# Patient Record
Sex: Male | Born: 1970 | Race: White | Hispanic: No | Marital: Married | Smoking: Never smoker
Health system: Southern US, Community
[De-identification: ages and names within clinical notes are randomized; demographics above are authoritative.]

## PROBLEM LIST (undated history)

## (undated) DIAGNOSIS — I10 Essential (primary) hypertension: Secondary | ICD-10-CM

## (undated) HISTORY — PX: NO PAST SURGERIES: SHX2092

---

## 2009-10-14 ENCOUNTER — Ambulatory Visit: Payer: Self-pay | Admitting: Urology

## 2009-11-05 ENCOUNTER — Ambulatory Visit: Payer: Self-pay | Admitting: Urology

## 2010-04-21 ENCOUNTER — Ambulatory Visit: Payer: Self-pay | Admitting: Internal Medicine

## 2018-01-08 HISTORY — PX: MOHS SURGERY: SUR867

## 2018-02-22 ENCOUNTER — Other Ambulatory Visit: Payer: Self-pay

## 2018-02-22 ENCOUNTER — Ambulatory Visit
Admission: EM | Admit: 2018-02-22 | Discharge: 2018-02-22 | Disposition: A | Discharge status: Home / Self Care | Payer: BLUE CROSS/BLUE SHIELD | Attending: Family Medicine | Admitting: Family Medicine

## 2018-02-22 ENCOUNTER — Encounter: Payer: Self-pay | Admitting: Emergency Medicine

## 2018-02-22 ENCOUNTER — Ambulatory Visit (INDEPENDENT_AMBULATORY_CARE_PROVIDER_SITE_OTHER)
Admit: 2018-02-22 | Discharge: 2018-02-22 | Disposition: A | Discharge status: Home / Self Care | Payer: BLUE CROSS/BLUE SHIELD

## 2018-02-22 DIAGNOSIS — J3489 Other specified disorders of nose and nasal sinuses: Secondary | ICD-10-CM

## 2018-02-22 DIAGNOSIS — J329 Chronic sinusitis, unspecified: Secondary | ICD-10-CM | POA: Diagnosis not present

## 2018-02-22 DIAGNOSIS — R51 Headache: Secondary | ICD-10-CM | POA: Diagnosis not present

## 2018-02-22 DIAGNOSIS — R519 Headache, unspecified: Secondary | ICD-10-CM

## 2018-02-22 HISTORY — DX: Essential (primary) hypertension: I10

## 2018-02-22 MED ORDER — FLUTICASONE PROPIONATE 50 MCG/ACT NA SUSP: 2.0000 | Freq: Every day | NASAL | 0 refills | Status: DC

## 2018-02-22 MED ORDER — PREDNISONE 50 MG PO TABS: ORAL_TABLET | ORAL | 0 refills | Status: DC

## 2018-02-22 MED ORDER — AMOXICILLIN-POT CLAVULANATE 875-125 MG PO TABS: 1.0000 | ORAL_TABLET | Freq: Two times a day (BID) | ORAL | 0 refills | Status: DC

## 2018-02-22 NOTE — ED Triage Notes (Signed)
Pt here today c/o of a headache. He reports that he has had it for about a week. Usually starts after he has been up for a while. He takes something for it (today Ibuprofen) the headache goes away but his head in that area is worse to the touch. Headache and tenderness is located above his left eye. Denies fevers, chills,  cold symptoms, n/v.No recent head trauma.

## 2018-02-22 NOTE — ED Provider Notes (Signed)
[facility]    CSN: [id number] Arrival date & time: 02/22/18  1430  History   Chief Complaint Chief Complaint  Patient presents with  . Headache   HPI  47 year old male presents with headache.  Headache  Patient reports a one-week history of daily headache.  Location: Left frontal region.  Patient reports that his headache has been severe.  Improves with ibuprofen.  No nausea/vomiting.  No photophobia.  No reports of eye watering.  No rhinorrhea or purulent nasal discharge.  He does describe the pain as a pressure sensation.  No known exacerbating factors.  No other associated symptoms.  No other complaints.  Past Medical History:  Diagnosis Date  . Hypertension    Past Surgical History:  Procedure Laterality Date  . MOHS SURGERY  01/2018  . NO PAST SURGERIES      Home Medications    Prior to Admission medications   Medication Sig Start Date End Date Taking? Authorizing Provider  valsartan-hydrochlorothiazide (DIOVAN-HCT) 320-25 MG tablet TAKE 1 TABLET BY MOUTH EVERY DAY 05/03/17  Yes [provider]  amoxicillin-clavulanate (AUGMENTIN) 875-125 MG tablet Take 1 tablet by mouth every 12 (twelve) hours. 02/22/18   Tommie Sams, DO  fluticasone (FLONASE) 50 MCG/ACT nasal spray Place 2 sprays into both nostrils daily. 02/22/18   Tommie Sams, DO  predniSONE (DELTASONE) 50 MG tablet 1 tablet daily x 5 days. 02/22/18   Tommie Sams, DO    Family History Family History  Problem Relation Age of Onset  . Healthy Mother   . Healthy Father     Social History Social History   Tobacco Use  . Smoking status: Never Smoker  . Smokeless tobacco: Never Used  Substance Use Topics  . Alcohol use: Never    Frequency: Never  . Drug use: Never     Allergies   Patient has no known allergies.   Review of Systems Review of Systems  Constitutional: Negative.   Eyes: Negative.   Gastrointestinal: Negative.   Neurological: Positive for  headaches.   Physical Exam Triage Vital Signs ED Triage Vitals  Enc Vitals Group     BP 02/22/18 1447 (!) 128/99     Pulse Rate 02/22/18 1447 80     Resp 02/22/18 1447 17     Temp 02/22/18 1447 98.4 F (36.9 C)     Temp Source 02/22/18 1447 Oral     SpO2 02/22/18 1447 98 %     Weight 02/22/18 1447 235 lb (106.6 kg)     Height 02/22/18 1447 5\' 9"  (1.753 m)     Head Circumference --      Peak Flow --      Pain Score 02/22/18 1446 1     Pain Loc --      Pain Edu? --      Excl. in GC? --    Updated Vital Signs BP (!) 128/99 (BP Location: Left Arm)   Pulse 80   Temp 98.4 F (36.9 C) (Oral)   Resp 17   Ht 5\' 9"  (1.753 m)   Wt 235 lb (106.6 kg)   SpO2 98%   BMI 34.70 kg/m   Visual Acuity Right Eye Distance:   Left Eye Distance:   Bilateral Distance:    Right Eye Near:   Left Eye Near:    Bilateral Near:     Physical Exam  Constitutional: He is oriented to person, place, and time. He appears well-developed. No distress.  HENT:  Head:  Normocephalic and atraumatic.  Patient with a discrete area of tenderness of the left frontal region.  Eyes: Conjunctivae are normal. Right eye exhibits no discharge. Left eye exhibits no discharge.  Cardiovascular: Normal rate and regular rhythm.  Pulmonary/Chest: Effort normal and breath sounds normal. He has no wheezes. He has no rales.  Neurological: He is alert and oriented to person, place, and time.  Psychiatric: He has a normal mood and affect. His behavior is normal.  Nursing note and vitals reviewed.  UC Treatments / Results  Labs (all labs ordered are listed, but only abnormal results are displayed) Labs Reviewed - No data to display  EKG None  Radiology Ct Head Wo Contrast  Result Date: 02/22/2018 CLINICAL DATA:  Headache with pressure over left eye region from 1 week EXAM: CT HEAD WITHOUT CONTRAST TECHNIQUE: Contiguous axial images were obtained from the base of the skull through the vertex without intravenous  contrast. COMPARISON:  None. FINDINGS: Brain: The ventricles are normal in size and configuration. There is an asymmetrically prominent sulcus in the left frontal region, a likely anatomic variant. There is no intracranial mass, hemorrhage, extra-axial fluid collection, or midline shift. Gray-white compartments appear within normal limits. No evident acute infarct. Vascular: No hyperdense vessel.  No evident vascular calcification. Skull: Bony calvarium appears intact. Sinuses/Orbits: There is opacification of multiple anterior ethmoid air cells on the left as well as essentially the entire left frontal sinus. There is opacification in the superior most aspect of the left maxillary antrum. There is evidence of nares obstruction on the left, likely due to polypoid change. Visualized orbits appear symmetric bilaterally. Other: Visualized mastoid air cells are clear. IMPRESSION: Multifocal paranasal sinus disease on the left. Obstruction of the visualized left naris, probably due to polypoid change. No intracranial mass or hemorrhage. Gray-white compartments appear normal. Electronically Signed   By: Bretta BangWilliam  Woodruff III M.D.   On: 02/22/2018 16:52    Procedures Procedures (including critical care time)  Medications Ordered in UC Medications - No data to display  Initial Impression / Assessment and Plan / UC Course  I have reviewed the triage vital signs and the nursing notes.  Pertinent labs & imaging results that were available during my care of the patient were reviewed by me and considered in my medical decision making (see chart for details).    47 year old male presents with new onset headache.  Etiology was unclear so CT was obtained.  CT revealed extensive sinus disease, particularly in the frontal region.  Placed on Augmentin, prednisone, Flonase.  Advised to see ENT if he fails to improve or worsens.  Final Clinical Impressions(s) / UC Diagnoses   Final diagnoses:  Sinus headache      Discharge Instructions     Meds as prescribed.  Take care  Dr. Adriana Simasook    ED Prescriptions    Medication Sig Dispense Auth. Provider   fluticasone (FLONASE) 50 MCG/ACT nasal spray Place 2 sprays into both nostrils daily. 16 g Jorrell Kuster G, DO   predniSONE (DELTASONE) 50 MG tablet 1 tablet daily x 5 days. 5 tablet Clarene Curran G, DO   amoxicillin-clavulanate (AUGMENTIN) 875-125 MG tablet Take 1 tablet by mouth every 12 (twelve) hours. 14 tablet Tommie Samsook, Rickiya Picariello G, DO     Controlled Substance Prescriptions [place] Controlled Substance Registry consulted? Not Applicable   Tommie SamsCook, Majd Tissue G, DO 02/22/18 1744

## 2018-02-22 NOTE — Discharge Instructions (Signed)
Meds as prescribed. ° °Take care ° °Dr. Chevon Fomby  °

## 2018-02-22 NOTE — ED Triage Notes (Signed)
Called for for pre cert for ct scan of head CT. auth # [id number]

## 2018-02-23 ENCOUNTER — Ambulatory Visit: Admit: 2018-02-23 | Payer: BLUE CROSS/BLUE SHIELD

## 2018-03-02 ENCOUNTER — Encounter: Payer: Self-pay | Admitting: Emergency Medicine

## 2018-03-02 ENCOUNTER — Ambulatory Visit (INDEPENDENT_AMBULATORY_CARE_PROVIDER_SITE_OTHER): Payer: BLUE CROSS/BLUE SHIELD

## 2018-03-02 ENCOUNTER — Other Ambulatory Visit: Payer: Self-pay

## 2018-03-02 ENCOUNTER — Ambulatory Visit
Admission: EM | Admit: 2018-03-02 | Discharge: 2018-03-02 | Disposition: A | Discharge status: Home / Self Care | Payer: BLUE CROSS/BLUE SHIELD | Attending: Family Medicine | Admitting: Family Medicine

## 2018-03-02 DIAGNOSIS — S56912A Strain of unspecified muscles, fascia and tendons at forearm level, left arm, initial encounter: Secondary | ICD-10-CM

## 2018-03-02 DIAGNOSIS — X500XXA Overexertion from strenuous movement or load, initial encounter: Secondary | ICD-10-CM

## 2018-03-02 DIAGNOSIS — M79602 Pain in left arm: Secondary | ICD-10-CM

## 2018-03-02 MED ORDER — HYDROCODONE-ACETAMINOPHEN 5-325 MG PO TABS: ORAL_TABLET | ORAL | 0 refills | Status: DC

## 2018-03-02 NOTE — ED Provider Notes (Signed)
[facility]    CSN: [id number] Arrival date & time: 03/02/18  1540     History   Chief Complaint Chief Complaint  Patient presents with  . Arm Injury    HPI Christopher Brooks is a 47 y.o. male.   47 yo male with a c/o left forearm pain since injuring it earlier today while he was lifting a kayak on top of his car. States he felt something pop. Denies any numbness/tingling.   The history is provided by the patient.  Arm Injury    Past Medical History:  Diagnosis Date  . Hypertension     There are no active problems to display for this patient.   Past Surgical History:  Procedure Laterality Date  . MOHS SURGERY  01/2018  . NO PAST SURGERIES         Home Medications    Prior to Admission medications   Medication Sig Start Date End Date Taking? Authorizing Provider  valsartan-hydrochlorothiazide (DIOVAN-HCT) 320-25 MG tablet TAKE 1 TABLET BY MOUTH EVERY DAY 05/03/17  Yes [provider]  amoxicillin-clavulanate (AUGMENTIN) 875-125 MG tablet Take 1 tablet by mouth every 12 (twelve) hours. 02/22/18   Tommie Sams, DO  fluticasone (FLONASE) 50 MCG/ACT nasal spray Place 2 sprays into both nostrils daily. 02/22/18   Tommie Sams, DO  HYDROcodone-acetaminophen (NORCO/VICODIN) 5-325 MG tablet 1-2 tabs po bid prn 03/02/18   Payton Mccallum, MD  predniSONE (DELTASONE) 50 MG tablet 1 tablet daily x 5 days. 02/22/18   Tommie Sams, DO    Family History Family History  Problem Relation Age of Onset  . Healthy Mother   . Healthy Father     Social History Social History   Tobacco Use  . Smoking status: Never Smoker  . Smokeless tobacco: Never Used  Substance Use Topics  . Alcohol use: Never    Frequency: Never  . Drug use: Never     Allergies   Patient has no known allergies.   Review of Systems Review of Systems   Physical Exam Triage Vital Signs ED Triage Vitals  Enc Vitals Group     BP 03/02/18 1638 123/88     Pulse Rate 03/02/18  1638 (!) 109     Resp 03/02/18 1638 16     Temp 03/02/18 1638 98.3 F (36.8 C)     Temp src --      SpO2 03/02/18 1638 98 %     Weight 03/02/18 1636 234 lb (106.1 kg)     Height 03/02/18 1636 5\' 9"  (1.753 m)     Head Circumference --      Peak Flow --      Pain Score 03/02/18 1636 8     Pain Loc --      Pain Edu? --      Excl. in GC? --    No data found.  Updated Vital Signs BP 123/88   Pulse (!) 109   Temp 98.3 F (36.8 C)   Resp 16   Ht 5\' 9"  (1.753 m)   Wt 234 lb (106.1 kg)   SpO2 98%   BMI 34.56 kg/m   Visual Acuity Right Eye Distance:   Left Eye Distance:   Bilateral Distance:    Right Eye Near:   Left Eye Near:    Bilateral Near:     Physical Exam  Constitutional: He appears well-developed and well-nourished. No distress.  Musculoskeletal:       Left forearm: He exhibits  tenderness, bony tenderness and swelling. He exhibits no edema, no deformity and no laceration.  Skin: He is not diaphoretic.  Nursing note and vitals reviewed.    UC Treatments / Results  Labs (all labs ordered are listed, but only abnormal results are displayed) Labs Reviewed - No data to display  EKG None  Radiology Dg Forearm Left  Result Date: 03/02/2018 CLINICAL DATA:  Left forearm pain after lifting a kayak onto his car and feeling a pop in the forearm. EXAM: LEFT FOREARM - 2 VIEW COMPARISON:  None. FINDINGS: There is no evidence of fracture or other focal bone lesions. Soft tissues are unremarkable. IMPRESSION: Normal examination. Electronically Signed   By: Beckie SaltsSteven  Reid M.D.   On: 03/02/2018 16:56    Procedures Procedures (including critical care time)  Medications Ordered in UC Medications - No data to display  Initial Impression / Assessment and Plan / UC Course  I have reviewed the triage vital signs and the nursing notes.  Pertinent labs & imaging results that were available during my care of the patient were reviewed by me and considered in my medical decision  making (see chart for details).      Final Clinical Impressions(s) / UC Diagnoses   Final diagnoses:  Forearm strain, left, initial encounter     Discharge Instructions     Rest, ice, advil (600mg  three times daily)    ED Prescriptions    Medication Sig Dispense Auth. Provider   HYDROcodone-acetaminophen (NORCO/VICODIN) 5-325 MG tablet 1-2 tabs po bid prn 6 tablet Payton Mccallumonty, Dillinger Aston, MD      1. X-ray results and diagnosis reviewed with patient 2. rx as per orders above; reviewed possible side effects, interactions, risks and benefits  3. Recommend supportive treatment as above  4. Follow-up prn if symptoms worsen or don't improve  Controlled Substance Prescriptions [place] Controlled Substance Registry consulted? Not Applicable   Payton Mccallumonty, Shannia Jacuinde, MD 03/02/18 [phone]

## 2018-03-02 NOTE — ED Triage Notes (Signed)
Patient states he was lifting a kayak up on top of his car and felt his left forearm pop.  Patient states it is very painful now

## 2018-03-02 NOTE — Discharge Instructions (Signed)
Rest, ice, advil (600mg  three times daily)

## 2018-03-17 ENCOUNTER — Other Ambulatory Visit: Payer: Self-pay | Admitting: Family Medicine

## 2019-07-05 ENCOUNTER — Other Ambulatory Visit: Payer: Self-pay

## 2019-07-05 ENCOUNTER — Ambulatory Visit
Admission: EM | Admit: 2019-07-05 | Discharge: 2019-07-05 | Disposition: A | Discharge status: Home / Self Care | Payer: BC Managed Care – PPO | Attending: Emergency Medicine | Admitting: Emergency Medicine

## 2019-07-05 DIAGNOSIS — R519 Headache, unspecified: Secondary | ICD-10-CM | POA: Diagnosis not present

## 2019-07-05 DIAGNOSIS — Z03818 Encounter for observation for suspected exposure to other biological agents ruled out: Secondary | ICD-10-CM

## 2019-07-05 LAB — SARS CORONAVIRUS 2 AG (30 MIN TAT): SARS Coronavirus 2 Ag: NEGATIVE

## 2019-07-05 MED ORDER — IBUPROFEN 600 MG PO TABS: 600.0000 mg | ORAL_TABLET | Freq: Four times a day (QID) | ORAL | 0 refills | Status: DC | PRN

## 2019-07-05 NOTE — ED Provider Notes (Signed)
HPI  SUBJECTIVE:  Christopher Brooks is a 48 y.o. male who reports gradual onset headaches starting yesterday.  They are located in the frontal region, intermittent, lasting hours.  He denies neck stiffness, photophobia, rash.  No fevers, body aches, nasal congestion, sore throat, loss of sense of smell or taste, cough, shortness of breath, nausea, vomiting, diarrhea, abdominal pain.  No antipyretic in the past 4 to 6 hours.  No known Covid exposure.  No arm or leg weakness, facial droop, discoordination, slurred speech.  No rhinorrhea, sinus pain or pressure.  No ear, dental, or jaw pain.  He has been taking Aleve and 4 mg ibuprofen with some improvement in symptoms.  No aggravating factors.  He states he drinks at least 2 L water a day.  He got a new contact prescription last week, but states that he was fine for several days prior to the headache starting.  No special care seen, space heater, known carbon monoxide sources.  They have a carbon monoxide sensor at home.  Headache is not worse at home.  His wife is also in with him today with a headache.  He has a past medical history of hypertension.  No history of diabetes, coronary disease, chronic kidney disease, HIV, immunocompromise, cancer, pulmonary disease, smoking.  PMD: Dr. Jimmie Molly at the [facility].  Past Medical History:  Diagnosis Date  . Hypertension     Past Surgical History:  Procedure Laterality Date  . MOHS SURGERY  01/2018  . NO PAST SURGERIES      Family History  Problem Relation Age of Onset  . Healthy Mother   . Healthy Father     Social History   Tobacco Use  . Smoking status: Never Smoker  . Smokeless tobacco: Never Used  Substance Use Topics  . Alcohol use: Never    Frequency: Never  . Drug use: Never    No current facility-administered medications for this encounter.   Current Outpatient Medications:  .  valsartan-hydrochlorothiazide (DIOVAN-HCT) 320-25 MG tablet, TAKE 1 TABLET BY MOUTH EVERY  DAY, Disp: , Rfl:  .  ibuprofen (ADVIL) 600 MG tablet, Take 1 tablet (600 mg total) by mouth every 6 (six) hours as needed., Disp: 30 tablet, Rfl: 0  No Known Allergies   ROS  As noted in HPI.   Physical Exam  BP 131/87 (BP Location: Left Arm)   Pulse 84   Temp 98.6 F (37 C) (Oral)   Resp 16   Ht 5' 8"  (1.727 m)   Wt 103 kg   SpO2 100%   BMI 34.52 kg/m   Constitutional: Well developed, well nourished, no acute distress Eyes: PERRL, EOMI, conjunctiva normal bilaterally.  No photophobia HENT: Normocephalic, atraumatic,mucus membranes moist, normal dentition.   No TMJ tenderness. No nasal congestion, no sinus tenderness.  Neck: no cervical LN - trapezial muscle tenderness. No meningismus Respiratory: normal inspiratory effort Cardiovascular: Normal rate, regular rhythm GI:  nondistended skin: No rash, skin intact Musculoskeletal: No edema, no tenderness, no deformities Neurologic: Alert & oriented x 3, CN III-XII grossly intact,gait steady, coordination normal Psychiatric: Speech and behavior appropriate   ED Course   Medications - No data to display  Orders Placed This Encounter  Procedures  . SARS Coronavirus 2 Ag (30 min TAT) - Nasal Swab (BD Veritor Kit)    Standing Status:   Standing    Number of Occurrences:   1    Order Specific Question:   Is this test for diagnosis or  screening    Answer:   Diagnosis of ill patient    Order Specific Question:   Symptomatic for COVID-19 as defined by CDC    Answer:   Yes    Order Specific Question:   Date of Symptom Onset    Answer:   07/03/2019    Order Specific Question:   Hospitalized for COVID-19    Answer:   No    Order Specific Question:   Admitted to ICU for COVID-19    Answer:   No    Order Specific Question:   Previously tested for COVID-19    Answer:   No    Order Specific Question:   Resident in a congregate (group) care setting    Answer:   No    Order Specific Question:   Employed in healthcare setting     Answer:   No  . Novel Coronavirus, NAA (Hosp order, Send-out to Ref Lab; TAT 18-24 hrs    Standing Status:   Standing    Number of Occurrences:   1    Order Specific Question:   Is this test for diagnosis or screening    Answer:   Diagnosis of ill patient    Order Specific Question:   Symptomatic for COVID-19 as defined by CDC    Answer:   Yes    Order Specific Question:   Date of Symptom Onset    Answer:   07/04/2019    Order Specific Question:   Hospitalized for COVID-19    Answer:   No    Order Specific Question:   Admitted to ICU for COVID-19    Answer:   No    Order Specific Question:   Previously tested for COVID-19    Answer:   Yes    Order Specific Question:   Resident in a congregate (group) care setting    Answer:   No    Order Specific Question:   Employed in healthcare setting    Answer:   No  . Airborne and Contact precautions    Standing Status:   Standing    Number of Occurrences:   1   Results for orders placed or performed during the hospital encounter of 07/05/19 (from the past 24 hour(s))  SARS Coronavirus 2 Ag (30 min TAT) - Nasal Swab (BD Veritor Kit)     Status: None   Collection Time: 07/05/19  1:48 PM   Specimen: Nasal Swab (BD Veritor Kit)  Result Value Ref Range   SARS Coronavirus 2 Ag NEGATIVE NEGATIVE   No results found.   ED Clinical Impression  1. Nonintractable headache, unspecified chronicity pattern, unspecified headache type     ED Assessment/Plan   no sudden onset. Doubt SAH, ICH or space occupying lesion. Pt without fevers/chills, Pt has no meningeal sx, no nuchal rigidity. Doubt meningitis. Pt with normal neuro exam, no evidence of CVA/TIA.  Pt BP not elevated significantly, doubt hypertensive emergency.  No evidence of carbon monoxide exposure.  Rapid COVID negative.  PCR sent.  Will d/c home with nsaid/Tylenol, and have pt F/U with PCP. Discussed  MDM, plan for follow up, signs and sx that should prompt return to ER. Pt agrees with  plan  Meds ordered this encounter  Medications  . ibuprofen (ADVIL) 600 MG tablet    Sig: Take 1 tablet (600 mg total) by mouth every 6 (six) hours as needed.    Dispense:  30 tablet    Refill:  0    *  This clinic note was created using [employer]. Therefore, there may be occasional mistakes despite careful proofreading.  ?    Melynda Ripple, MD 07/06/19 [phone]

## 2019-07-05 NOTE — ED Triage Notes (Signed)
Patient complains of headache that started yesterday, concerned for covid due to exposure.

## 2019-07-05 NOTE — Discharge Instructions (Addendum)
Take 600 mg of ibuprofen combined with 1 g of Tylenol 3-4 times a day as needed.  Continue drinking plenty of fluids.  Your Covid PCR test will come back in 24 to 48 hours.  Isolate yourself until you know the results of this is.

## 2019-07-06 LAB — NOVEL CORONAVIRUS, NAA (HOSP ORDER, SEND-OUT TO REF LAB; TAT 18-24 HRS): SARS-CoV-2, NAA: NOT DETECTED

## 2020-11-17 ENCOUNTER — Emergency Department
Admission: EM | Admit: 2020-11-17 | Discharge: 2020-11-18 | Disposition: A | Discharge status: Home / Self Care | Payer: BC Managed Care – PPO | Attending: Emergency Medicine | Admitting: Emergency Medicine

## 2020-11-17 ENCOUNTER — Encounter: Payer: Self-pay | Admitting: Emergency Medicine

## 2020-11-17 ENCOUNTER — Other Ambulatory Visit: Payer: Self-pay

## 2020-11-17 DIAGNOSIS — Z79899 Other long term (current) drug therapy: Secondary | ICD-10-CM | POA: Insufficient documentation

## 2020-11-17 DIAGNOSIS — I10 Essential (primary) hypertension: Secondary | ICD-10-CM | POA: Diagnosis not present

## 2020-11-17 DIAGNOSIS — R066 Hiccough: Secondary | ICD-10-CM | POA: Diagnosis not present

## 2020-11-17 NOTE — ED Triage Notes (Signed)
pt c/o hiccups for the last 6 days and was advised by PCP to [facility] ED

## 2020-11-18 ENCOUNTER — Encounter: Payer: Self-pay | Admitting: Radiology

## 2020-11-18 ENCOUNTER — Emergency Department: Payer: BC Managed Care – PPO

## 2020-11-18 LAB — COMPREHENSIVE METABOLIC PANEL
ALT: 16 U/L (ref 0–44)
AST: 24 U/L (ref 15–41)
Albumin: 3.9 g/dL (ref 3.5–5.0)
Alkaline Phosphatase: 72 U/L (ref 38–126)
Anion gap: 8 (ref 5–15)
BUN: 15 mg/dL (ref 6–20)
CO2: 26 mmol/L (ref 22–32)
Calcium: 8.6 mg/dL — ABNORMAL LOW (ref 8.9–10.3)
Chloride: 110 mmol/L (ref 98–111)
Creatinine, Ser: 1.13 mg/dL (ref 0.61–1.24)
GFR, Estimated: >60 mL/min (ref >60)
Glucose, Bld: 117 mg/dL — ABNORMAL HIGH (ref 70–99)
Potassium: 3.4 mmol/L — ABNORMAL LOW (ref 3.5–5.1)
Sodium: 144 mmol/L (ref 135–145)
Total Bilirubin: 0.5 mg/dL (ref 0.3–1.2)
Total Protein: 6.5 g/dL (ref 6.5–8.1)

## 2020-11-18 LAB — TSH: TSH: 1.491 u[IU]/mL (ref 0.350–4.500)

## 2020-11-18 LAB — CBC
HCT: 47.1 % (ref 39.0–52.0)
Hemoglobin: 15.9 g/dL (ref 13.0–17.0)
MCH: 27.1 pg (ref 26.0–34.0)
MCHC: 33.8 g/dL (ref 30.0–36.0)
MCV: 80.4 fL (ref 80.0–100.0)
Platelets: 206 10*3/uL (ref 150–400)
RBC: 5.86 MIL/uL — ABNORMAL HIGH (ref 4.22–5.81)
RDW: 14 % (ref 11.5–15.5)
WBC: 8.9 10*3/uL (ref 4.0–10.5)
nRBC: 0 % (ref 0.0–0.2)

## 2020-11-18 LAB — TROPONIN I (HIGH SENSITIVITY)
Troponin I (High Sensitivity): 4 ng/L (ref <18)
Troponin I (High Sensitivity): 4 ng/L (ref <18)

## 2020-11-18 LAB — T4, FREE: Free T4: 0.97 ng/dL (ref 0.61–1.12)

## 2020-11-18 MED ORDER — POTASSIUM CHLORIDE CRYS ER 20 MEQ PO TBCR
40.0000 meq | EXTENDED_RELEASE_TABLET | Freq: Once | ORAL | Status: AC
  Administered 2020-11-18: 40 meq via ORAL
  Filled 2020-11-18: qty 2

## 2020-11-18 MED ORDER — METOCLOPRAMIDE HCL 5 MG/ML IJ SOLN
10.0000 mg | Freq: Once | INTRAMUSCULAR | Status: AC
  Administered 2020-11-18: 10 mg via INTRAVENOUS
  Filled 2020-11-18: qty 2

## 2020-11-18 MED ORDER — CHLORPROMAZINE HCL 25 MG/ML IJ SOLN
25.0000 mg | Freq: Once | INTRAMUSCULAR | Status: DC
  Filled 2020-11-18: qty 1

## 2020-11-18 MED ORDER — PANTOPRAZOLE SODIUM 20 MG PO TBEC: 20.0000 mg | DELAYED_RELEASE_TABLET | Freq: Every day | ORAL | 0 refills | Status: DC

## 2020-11-18 MED ORDER — PANTOPRAZOLE SODIUM 40 MG IV SOLR
40.0000 mg | Freq: Once | INTRAVENOUS | Status: AC
  Administered 2020-11-18: 40 mg via INTRAVENOUS
  Filled 2020-11-18: qty 40

## 2020-11-18 MED ORDER — CALCIUM GLUCONATE-NACL 1-0.675 GM/50ML-% IV SOLN
1.0000 g | Freq: Once | INTRAVENOUS | Status: AC
  Administered 2020-11-18: 1000 mg via INTRAVENOUS
  Filled 2020-11-18: qty 50

## 2020-11-18 MED ORDER — METOCLOPRAMIDE HCL 10 MG PO TABS: 10.0000 mg | ORAL_TABLET | Freq: Four times a day (QID) | ORAL | 0 refills | Status: DC | PRN

## 2020-11-18 MED ORDER — IOHEXOL 350 MG/ML SOLN
100.0000 mL | Freq: Once | INTRAVENOUS | Status: AC | PRN
  Administered 2020-11-18: 100 mL via INTRAVENOUS

## 2020-11-18 NOTE — ED Notes (Signed)
Hiccups came back after ct, provider aware

## 2020-11-18 NOTE — ED Provider Notes (Signed)
[facility] Emergency Department Provider Note  ____________________________________________   Event Date/Time   First MD Initiated Contact with Patient 11/17/20 2354     (approximate)  I have reviewed the triage vital signs and the nursing notes.   HISTORY  Chief Complaint Hiccups    HPI Christopher Brooks is a 50 y.o. male with hypertension who comes in with hiccups.  Patient had 6 days of hiccups.  They have been mostly constant however he states that the longest.  He will go without the hiccups was 30 minutes.  Nothing brought it on, nothing made it worse.  Denies any changes in medications.  Denies any other symptoms associated with it.  No chest pain, no weakness in his arms or legs, no fevers.  Otherwise feels at his baseline self.  Denies this ever happening previously.  Was told by family friend who is a doctor to come in to be evaluated.            Past Medical History:  Diagnosis Date  . Hypertension     There are no problems to display for this patient.   Past Surgical History:  Procedure Laterality Date  . MOHS SURGERY  01/2018  . NO PAST SURGERIES      Prior to Admission medications   Medication Sig Start Date End Date Taking? Authorizing Provider  ibuprofen (ADVIL) 600 MG tablet Take 1 tablet (600 mg total) by mouth every 6 (six) hours as needed. 07/05/19   Domenick GongMortenson, Ashley, MD  valsartan-hydrochlorothiazide (DIOVAN-HCT) 320-25 MG tablet TAKE 1 TABLET BY MOUTH EVERY DAY 05/03/17   [provider]  fluticasone (FLONASE) 50 MCG/ACT nasal spray Place 2 sprays into both nostrils daily. 02/22/18 07/05/19  Tommie Samsook, Jayce G, DO    Allergies Patient has no known allergies.  Family History  Problem Relation Age of Onset  . Healthy Mother   . Healthy Father     Social History Social History   Tobacco Use  . Smoking status: Never Smoker  . Smokeless tobacco: Never Used  Vaping Use  . Vaping Use: Never used  Substance  Use Topics  . Alcohol use: Never  . Drug use: Never      Review of Systems Constitutional: No fever/chills, positive hiccups Eyes: No visual changes. ENT: No sore throat. Cardiovascular: Denies chest pain. Respiratory: Denies shortness of breath. Gastrointestinal: No abdominal pain.  No nausea, no vomiting.  No diarrhea.  No constipation. Genitourinary: Negative for dysuria. Musculoskeletal: Negative for back pain. Skin: Negative for rash. Neurological: Negative for headaches, focal weakness or numbness. All other ROS negative ____________________________________________   PHYSICAL EXAM:  VITAL SIGNS: ED Triage Vitals  Enc Vitals Group     BP 11/17/20 2331 130/86     Pulse Rate 11/17/20 2331 68     Resp 11/17/20 2331 17     Temp 11/17/20 2331 97.7 F (36.5 C)     Temp Source 11/17/20 2331 Oral     SpO2 11/17/20 2331 100 %     Weight 11/17/20 2331 214 lb (97.1 kg)     Height 11/17/20 2331 5\' 9"  (1.753 m)     Head Circumference --      Peak Flow --      Pain Score 11/17/20 2335 0     Pain Loc --      Pain Edu? --      Excl. in GC? --     Constitutional: Alert and oriented. Well appearing and in no acute  distress although frequently hiccuping Eyes: Conjunctivae are normal. EOMI. Head: Atraumatic.  TMs with mild amount of earwax no foreign body Nose: No congestion/rhinnorhea. Mouth/Throat: Mucous membranes are moist.   Neck: No stridor. Trachea Midline. FROM Cardiovascular: Normal rate, regular rhythm. Grossly normal heart sounds.  Good peripheral circulation. Respiratory: Normal respiratory effort.  No retractions. Lungs CTAB. Gastrointestinal: Soft and nontender. No distention. No abdominal bruits.  Musculoskeletal: No lower extremity tenderness nor edema.  No joint effusions. Neurologic:  Normal speech and language. No gross focal neurologic deficits are appreciated.  Cranial nerves II through XII are intact.  Equal strength in arms and legs. Skin:  Skin is warm,  dry and intact. No rash noted. Psychiatric: Mood and affect are normal. Speech and behavior are normal. GU: Deferred   ____________________________________________   LABS (all labs ordered are listed, but only abnormal results are displayed)  Labs Reviewed  COMPREHENSIVE METABOLIC PANEL - Abnormal; Notable for the following components:      Result Value   Potassium 3.4 (*)    Glucose, Bld 117 (*)    Calcium 8.6 (*)    All other components within normal limits  CBC - Abnormal; Notable for the following components:   RBC 5.86 (*)    All other components within normal limits  TSH  T4, FREE  CBC WITH DIFFERENTIAL/PLATELET  TROPONIN I (HIGH SENSITIVITY)  TROPONIN I (HIGH SENSITIVITY)   ____________________________________________   ED ECG REPORT I, Concha Se, the attending physician, personally viewed and interpreted this ECG.  Sinus rate of 64, no ST elevation, no T wave inversions, normal intervals ____________________________________________  RADIOLOGY Vela Prose, personally viewed and evaluated these images (plain radiographs) as part of my medical decision making, as well as reviewing the written report by the radiologist.  ED MD interpretation: No mass  Official radiology report(s): DG Chest 2 View  Result Date: 11/18/2020 CLINICAL DATA:  Hiccups EXAM: CHEST - 2 VIEW COMPARISON:  None. FINDINGS: Mild bronchitic changes. No focal consolidation or effusion. Normal cardiomediastinal silhouette. No pneumothorax. IMPRESSION: Mild bronchitic changes. No focal pulmonary infiltrate. Electronically Signed   By: Jasmine Pang M.D.   On: 11/18/2020 01:08   CT Head Wo Contrast  Result Date: 11/18/2020 CLINICAL DATA:  Dizziness, intractable hiccups EXAM: CT HEAD WITHOUT CONTRAST TECHNIQUE: Contiguous axial images were obtained from the base of the skull through the vertex without intravenous contrast. COMPARISON:  None. FINDINGS: Brain: Normal anatomic configuration. No  abnormal intra or extra-axial mass lesion or fluid collection. No abnormal mass effect or midline shift. No evidence of acute intracranial hemorrhage or infarct. Ventricular size is normal. Cerebellum unremarkable. Vascular: Unremarkable Skull: Intact Sinuses/Orbits: Paranasal sinuses are clear. Orbits are unremarkable. Other: Mastoid air cells and middle ear cavities are clear. IMPRESSION: Normal examination.  No acute intracranial abnormality. Electronically Signed   By: Helyn Numbers MD   On: 11/18/2020 05:01   CT Angio Chest PE W and/or Wo Contrast  Result Date: 11/18/2020 CLINICAL DATA:  Dyspnea, intractable hiccups EXAM: CT ANGIOGRAPHY CHEST CT ABDOMEN AND PELVIS WITH CONTRAST TECHNIQUE: Multidetector CT imaging of the chest was performed using the standard protocol during bolus administration of intravenous contrast. Multiplanar CT image reconstructions and MIPs were obtained to evaluate the vascular anatomy. Multidetector CT imaging of the abdomen and pelvis was performed using the standard protocol during bolus administration of intravenous contrast. CONTRAST:  OMNIPAQUE IOHEXOL 350 MG/ML SOLN COMPARISON:  None. FINDINGS: CTA CHEST FINDINGS Cardiovascular: There is slightly suboptimal opacification of  the central pulmonary arteries due to motion artifact and suboptimal bolus timing. The examination is adequate for exclusion of intraluminal filling defects within the main, right, left, and lobar pulmonary arteries, of which there is none. The segmental and subsegmental pulmonary arteries are not adequately opacified to definitively exclude the presence of small pulmonary emboli. The central pulmonary arteries are of normal caliber. No significant coronary artery calcification. Global cardiac size within normal limits. No pericardial effusion. The thoracic aorta is unremarkable. Mediastinum/Nodes: The visualized thyroid is unremarkable. No pathologic thoracic adenopathy. Small hiatal hernia.  Lungs/Pleura: Lungs are clear. No pleural effusion or pneumothorax. Musculoskeletal: No acute bone abnormality within the thorax. No lytic or blastic bone lesion identified. Review of the MIP images confirms the above findings. CT ABDOMEN and PELVIS FINDINGS Hepatobiliary: No focal liver abnormality is seen. No gallstones, gallbladder wall thickening, or biliary dilatation. Pancreas: Unremarkable Spleen: Unremarkable Adrenals/Urinary Tract: The adrenal glands are unremarkable. The kidneys are normal in size and position. Multiple nonobstructing calculi are noted within the kidneys bilaterally measuring up to 3 mm within the lower pole bilaterally. Simple cortical cyst noted within the lower pole the right kidney. No hydronephrosis. No ureteral calculi. Bladder unremarkable. Stomach/Bowel: Stomach, small bowel, and large bowel are unremarkable save for mild sigmoid diverticulosis. Appendix normal. No free intraperitoneal gas or fluid. Vascular/Lymphatic: No significant vascular findings are present. No enlarged abdominal or pelvic lymph nodes. Reproductive: Prostate is unremarkable. Other: Small fat containing umbilical and right inguinal hernias are noted. The rectum is unremarkable. Musculoskeletal: No acute bone abnormality within the abdomen and pelvis. No lytic or blastic bone lesion. Review of the MIP images confirms the above findings. IMPRESSION: No acute intrathoracic or intra-abdominal pathology identified. Slightly limited evaluation of the pulmonary arterial tree without evidence of large central pulmonary embolism. Note that the segmental and subsegmental pulmonary arteries are not well assessed with this examination. Mild bilateral nonobstructing nephrolithiasis. No urolithiasis. No hydronephrosis. Small hiatal hernia. Electronically Signed   By: Helyn Numbers MD   On: 11/18/2020 05:14   CT ABDOMEN PELVIS W CONTRAST  Result Date: 11/18/2020 CLINICAL DATA:  Dyspnea, intractable hiccups EXAM: CT  ANGIOGRAPHY CHEST CT ABDOMEN AND PELVIS WITH CONTRAST TECHNIQUE: Multidetector CT imaging of the chest was performed using the standard protocol during bolus administration of intravenous contrast. Multiplanar CT image reconstructions and MIPs were obtained to evaluate the vascular anatomy. Multidetector CT imaging of the abdomen and pelvis was performed using the standard protocol during bolus administration of intravenous contrast. CONTRAST:  OMNIPAQUE IOHEXOL 350 MG/ML SOLN COMPARISON:  None. FINDINGS: CTA CHEST FINDINGS Cardiovascular: There is slightly suboptimal opacification of the central pulmonary arteries due to motion artifact and suboptimal bolus timing. The examination is adequate for exclusion of intraluminal filling defects within the main, right, left, and lobar pulmonary arteries, of which there is none. The segmental and subsegmental pulmonary arteries are not adequately opacified to definitively exclude the presence of small pulmonary emboli. The central pulmonary arteries are of normal caliber. No significant coronary artery calcification. Global cardiac size within normal limits. No pericardial effusion. The thoracic aorta is unremarkable. Mediastinum/Nodes: The visualized thyroid is unremarkable. No pathologic thoracic adenopathy. Small hiatal hernia. Lungs/Pleura: Lungs are clear. No pleural effusion or pneumothorax. Musculoskeletal: No acute bone abnormality within the thorax. No lytic or blastic bone lesion identified. Review of the MIP images confirms the above findings. CT ABDOMEN and PELVIS FINDINGS Hepatobiliary: No focal liver abnormality is seen. No gallstones, gallbladder wall thickening, or biliary dilatation. Pancreas: Unremarkable  Spleen: Unremarkable Adrenals/Urinary Tract: The adrenal glands are unremarkable. The kidneys are normal in size and position. Multiple nonobstructing calculi are noted within the kidneys bilaterally measuring up to 3 mm within the lower pole  bilaterally. Simple cortical cyst noted within the lower pole the right kidney. No hydronephrosis. No ureteral calculi. Bladder unremarkable. Stomach/Bowel: Stomach, small bowel, and large bowel are unremarkable save for mild sigmoid diverticulosis. Appendix normal. No free intraperitoneal gas or fluid. Vascular/Lymphatic: No significant vascular findings are present. No enlarged abdominal or pelvic lymph nodes. Reproductive: Prostate is unremarkable. Other: Small fat containing umbilical and right inguinal hernias are noted. The rectum is unremarkable. Musculoskeletal: No acute bone abnormality within the abdomen and pelvis. No lytic or blastic bone lesion. Review of the MIP images confirms the above findings. IMPRESSION: No acute intrathoracic or intra-abdominal pathology identified. Slightly limited evaluation of the pulmonary arterial tree without evidence of large central pulmonary embolism. Note that the segmental and subsegmental pulmonary arteries are not well assessed with this examination. Mild bilateral nonobstructing nephrolithiasis. No urolithiasis. No hydronephrosis. Small hiatal hernia. Electronically Signed   By: Helyn Numbers MD   On: 11/18/2020 05:14    ____________________________________________   PROCEDURES  Procedure(s) performed (including Critical Care):  Procedures   ____________________________________________   INITIAL IMPRESSION / ASSESSMENT AND PLAN / ED COURSE  Christopher Brooks was evaluated in Emergency Department on 11/18/2020 for the symptoms described in the history of present illness. He was evaluated in the context of the global COVID-19 pandemic, which necessitated consideration that the patient might be at risk for infection with the SARS-CoV-2 virus that causes COVID-19. Institutional protocols and algorithms that pertain to the evaluation of patients at risk for COVID-19 are in a state of rapid change based on information released by regulatory bodies  including the CDC and federal and state organizations. These policies and algorithms were followed during the patient's care in the ED.    Patient is a well-appearing 50 year old who is had persistent hiccups.  Patient has had over 6 days of hiccups.  Will get labs to evaluate for hyponatremia and hypocalcemia.  Chest x-ray to evaluate for mass, EKG and cardiac marker to evaluate for ACS.  No evidence of foreign body on TMs.  Seems less likely to be related to an infectious process.  Patient is afebrile and very well-appearing.  Low suspicion for meningitis.  Also no evidence of stroke upon my examination.  He denies any alcohol use.  Recent guidelines per up-to-date recommend starting a PPI in case it could be related to gastritis and will also trial some Reglan to see if we can help the hiccups stopped.   3:27 AM reevaluated patient.  He denies any symptoms of gastritis.  Patient states that the hiccups of gotten much better with the medication but he still having them occasionally.  We discussed his options including going home and starting on the PPI and Reglan and see how he is doing versus getting CT imaging now to further evaluate.  He does report having some weight loss recently therefore we will proceed with CT imaging of the head chest abdomen and pelvis to rule out any evidence of mass, PE, obstruction that could be contributing to the hiccups.  CT scans were negative but does show a small hiatal hernia.  This could be contributing if there is some gastritis associated with it.  Discussed with wife who is at bedside and the CTs were otherwise negative for mass.  Patient's hiccups have subsided  substantially although it did come back for a few minutes after coming back from the CT scanner.  But on my evaluation patient was asleep and had no hiccups at that time.  We discussed starting the PPI and Reglan and following up with his PCP.  He may need additional work-up.  I also instructed her to  follow-up with them for his low calcium level and low potassium level which I have given some repletion for here.  At this time there is no evidence of stroke I do not think patient needs an MRI and no evidence of meningitis to suggest needing LP.  Patient is very well-appearing otherwise with normal vital signs and I feels appropriate for trialing these medications and following up with PCP      ____________________________________________   FINAL CLINICAL IMPRESSION(S) / ED DIAGNOSES   Final diagnoses:  Hiccups      MEDICATIONS GIVEN DURING THIS VISIT:  Medications  potassium chloride SA (KLOR-CON) CR tablet 40 mEq (has no administration in time range)  pantoprazole (PROTONIX) injection 40 mg (40 mg Intravenous Given 11/18/20 0028)  metoCLOPramide (REGLAN) injection 10 mg (10 mg Intravenous Given 11/18/20 0030)  calcium gluconate 1 g/ 50 mL sodium chloride IVPB (0 mg Intravenous Stopped 11/18/20 0459)  iohexol (OMNIPAQUE) 350 MG/ML injection 100 mL (100 mLs Intravenous Contrast Given 11/18/20 0428)     ED Discharge Orders         Ordered    pantoprazole (PROTONIX) 20 MG tablet  Daily        11/18/20 0550    metoCLOPramide (REGLAN) 10 MG tablet  Every 6 hours PRN        11/18/20 0550           Note:  This document was prepared using Dragon voice recognition software and may include unintentional dictation errors.   Concha Se, MD 11/18/20 [phone]

## 2020-11-18 NOTE — Discharge Instructions (Addendum)
Your work-up was reassuring.  We want to start you on medications to help reduce the acid in your stomach in case this could be from some gastritis given your CT scan does show a small hiatal hernia.  We also going to start you on Reglan that helps with the hiccups as well.  You take this every 6-8 hours as needed for the hiccups.  Follow-up with your primary care doctor for further work-up of this however your CT scans were reassuring.  Your calcium and potassium were slightly low and we repleted but  you can discuss this with your primary care doctor.   No acute intrathoracic or intra-abdominal pathology identified.   Slightly limited evaluation of the pulmonary arterial tree without evidence of large central pulmonary embolism. Note that the segmental and subsegmental pulmonary arteries are not well assessed with this examination.   Mild bilateral nonobstructing nephrolithiasis. No urolithiasis. No hydronephrosis.   Small hiatal hernia.

## 2020-11-18 NOTE — ED Notes (Addendum)
Hiccups have currently stopped, provider aware

## 2020-11-18 NOTE — ED Notes (Signed)
Called lab to check on labs, reports had to rerun specimen d/t machine issues

## 2022-08-26 ENCOUNTER — Encounter: Payer: Self-pay | Admitting: Urology

## 2022-08-26 ENCOUNTER — Ambulatory Visit: Payer: Self-pay | Admitting: Urology

## 2022-09-11 ENCOUNTER — Encounter: Payer: Self-pay | Admitting: Urology

## 2022-09-11 ENCOUNTER — Other Ambulatory Visit
Admission: RE | Admit: 2022-09-11 | Discharge: 2022-09-11 | Disposition: A | Discharge status: Home / Self Care | Payer: BC Managed Care – PPO | Attending: Urology | Admitting: Urology

## 2022-09-11 ENCOUNTER — Other Ambulatory Visit: Payer: Self-pay | Admitting: *Deleted

## 2022-09-11 ENCOUNTER — Ambulatory Visit: Payer: BC Managed Care – PPO | Admitting: Urology

## 2022-09-11 VITALS — BP 129/87 | HR 81 | Ht 69.0 in | Wt 215.0 lb

## 2022-09-11 DIAGNOSIS — N5082 Scrotal pain: Secondary | ICD-10-CM | POA: Diagnosis present

## 2022-09-11 DIAGNOSIS — Z87442 Personal history of urinary calculi: Secondary | ICD-10-CM | POA: Diagnosis not present

## 2022-09-11 LAB — URINALYSIS, COMPLETE (UACMP) WITH MICROSCOPIC
Bacteria, UA: NONE SEEN
Bilirubin Urine: NEGATIVE
Glucose, UA: NEGATIVE mg/dL
Hgb urine dipstick: NEGATIVE
Ketones, ur: NEGATIVE mg/dL
Leukocytes,Ua: NEGATIVE
Nitrite: NEGATIVE
Protein, ur: NEGATIVE mg/dL
Specific Gravity, Urine: 1.025 (ref 1.005–1.030)
Squamous Epithelial / HPF: NONE SEEN /HPF (ref 0–5)
pH: 5 (ref 5.0–8.0)

## 2022-09-11 NOTE — Progress Notes (Signed)
Christopher Brooks,acting as a Education administrator for Christopher Espy, Christopher Brooks.,have documented all relevant documentation on the behalf of Christopher Espy, Christopher Brooks,as directed by  Christopher Espy, Christopher Brooks while in the presence of Christopher Espy, Christopher Brooks.  09/11/2022 2:28 PM   Christopher Brooks 09-03-1970 [id number]  Referring provider: Ellene [address] [place],  [place] [address]  Chief Complaint  Patient presents with   Groin Swelling   New Patient (Initial Visit)    HPI:  52 year old male who presents for further discussion regarding a history of kidney stones as well as several nodules on his testicles.   He informs that he successfully passed a kidney stone one month ago. He recollects having passed around 15 kidney stones throughout his lifetime. Around 26 years ago, he encountered a challenging one which necessitated a visit to the emergency room, but eventually he managed to pass it naturally.  He stated that during a self examination, he observed a number of nodules on his testicles. He does not experience any accompanying pain. He initially noticed them subsequent to his vasectomy.  Results for orders placed or performed during the hospital encounter of 09/11/22  Urinalysis, Complete w Microscopic -  Result Value Ref Range   Color, Urine YELLOW YELLOW   APPearance CLEAR CLEAR   Specific Gravity, Urine 1.025 1.005 - 1.030   pH 5.0 5.0 - 8.0   Glucose, UA NEGATIVE NEGATIVE mg/dL   Hgb urine dipstick NEGATIVE NEGATIVE   Bilirubin Urine NEGATIVE NEGATIVE   Ketones, ur NEGATIVE NEGATIVE mg/dL   Protein, ur NEGATIVE NEGATIVE mg/dL   Nitrite NEGATIVE NEGATIVE   Leukocytes,Ua NEGATIVE NEGATIVE   Squamous Epithelial / HPF NONE SEEN 0 - 5 /HPF   WBC, UA 0-5 0 - 5 WBC/hpf   RBC / HPF 0-5 0 - 5 RBC/hpf   Bacteria, UA NONE SEEN NONE SEEN    PMH: Past Medical History:  Diagnosis Date   Hypertension     Surgical History: Past Surgical History:  Procedure Laterality Date   MOHS  SURGERY  01/2018   NO PAST SURGERIES      Home Medications:  Allergies as of 09/11/2022   No Known Allergies      Medication List        Accurate as of September 11, 2022  2:28 PM. If you have any questions, ask your nurse or doctor.          STOP taking these medications    ibuprofen 600 MG tablet Commonly known as: ADVIL Stopped by: Christopher Espy, Christopher Brooks   metoCLOPramide 10 MG tablet Commonly known as: REGLAN Stopped by: Christopher Espy, Christopher Brooks   pantoprazole 20 MG tablet Commonly known as: Protonix Stopped by: Christopher Espy, Christopher Brooks       TAKE these medications    buPROPion 300 MG 24 hr tablet Commonly known as: WELLBUTRIN XL Take 300 mg by mouth daily.   tadalafil 20 MG tablet Commonly known as: CIALIS Take 20 mg by mouth daily as needed for erectile dysfunction.   valsartan-hydrochlorothiazide 320-25 MG tablet Commonly known as: DIOVAN-HCT Take 1 tablet by mouth daily. What changed: Another medication with the same name was removed. Continue taking this medication, and follow the directions you see here. Changed by: Christopher Espy, Christopher Brooks   Wegovy 2.4 MG/0.75ML Soaj Generic drug: Semaglutide-Weight Management Inject 2.4 mg into the skin once a week.   Wegovy 1.7 MG/0.75ML Soaj Generic drug: Semaglutide-Weight Management Inject 1.7 mg into the skin once a week.  Family History: Family History  Problem Relation Age of Onset   Healthy Mother    Healthy Father     Social History:  reports that he has never smoked. He has never been exposed to tobacco smoke. He has never used smokeless tobacco. He reports that he does not drink alcohol and does not use drugs.   Physical Exam: BP 129/87   Pulse 81   Ht 5\' 9"  (1.753 m)   Wt 215 lb (97.5 kg)   BMI 31.75 kg/m   Constitutional:  Alert and oriented, No acute distress. HEENT: [place] AT, moist mucus membranes.  Trachea midline, no masses. GU: Normal descended testicles. Normal extra testicular structures  including the epididymis. Slightly pronounced, convoluted vas with a disruption consistent with vasectomy but otherwise normal. Neurologic: Grossly intact, no focal deficits, moving all 4 extremities. Psychiatric: Normal mood and affect.   Assessment & Plan:   History of kidney stones - We discussed general stone prevention techniques including drinking plenty water with goal of producing 2.5 L urine daily, increased citric acid intake, avoidance of high oxalate containing foods, and decreased salt intake.  Information about dietary recommendations given today.  - He is currently asymptomatic. -He was offered a 24 hour urine metabolic evaluation given the frequency of stones, but this was declined.  - He was also offered a baseline KUB today, but would prefer to hold off unless he is having symptoms.   Return if symptoms worsen or fail to improve.  [address], [place], [place] [address] [phone]

## 2022-10-20 IMAGING — CR DG CHEST 2V
1 series · 2 of 2 positions shown · non-contrast
Comparison: None.

CLINICAL DATA: Hiccups

EXAM:
CHEST - 2 VIEW

[Series 1: dg chest 2 view · 0.14mm/px · 2 of 2 slices shown]
[im 1/2]
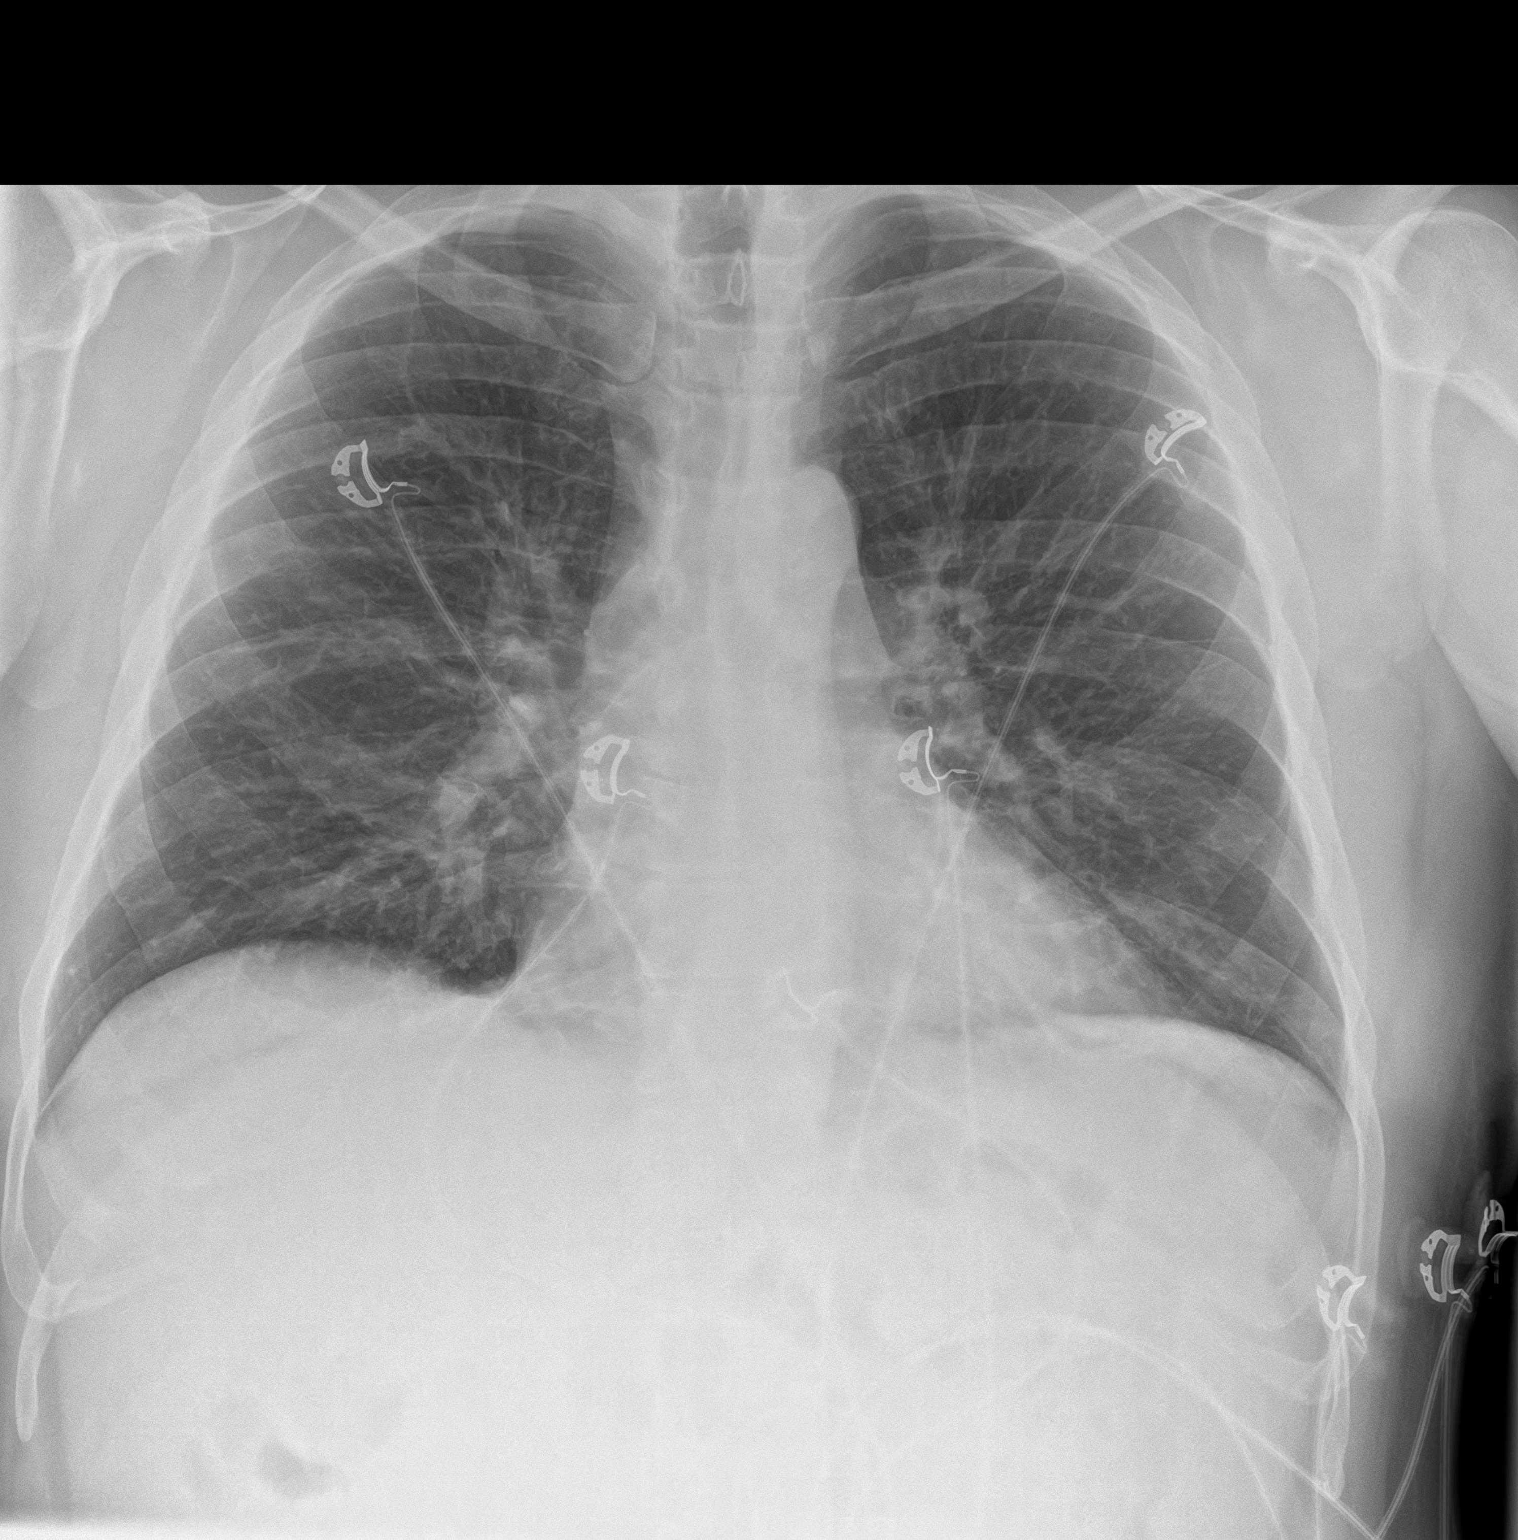
[im 2/2]
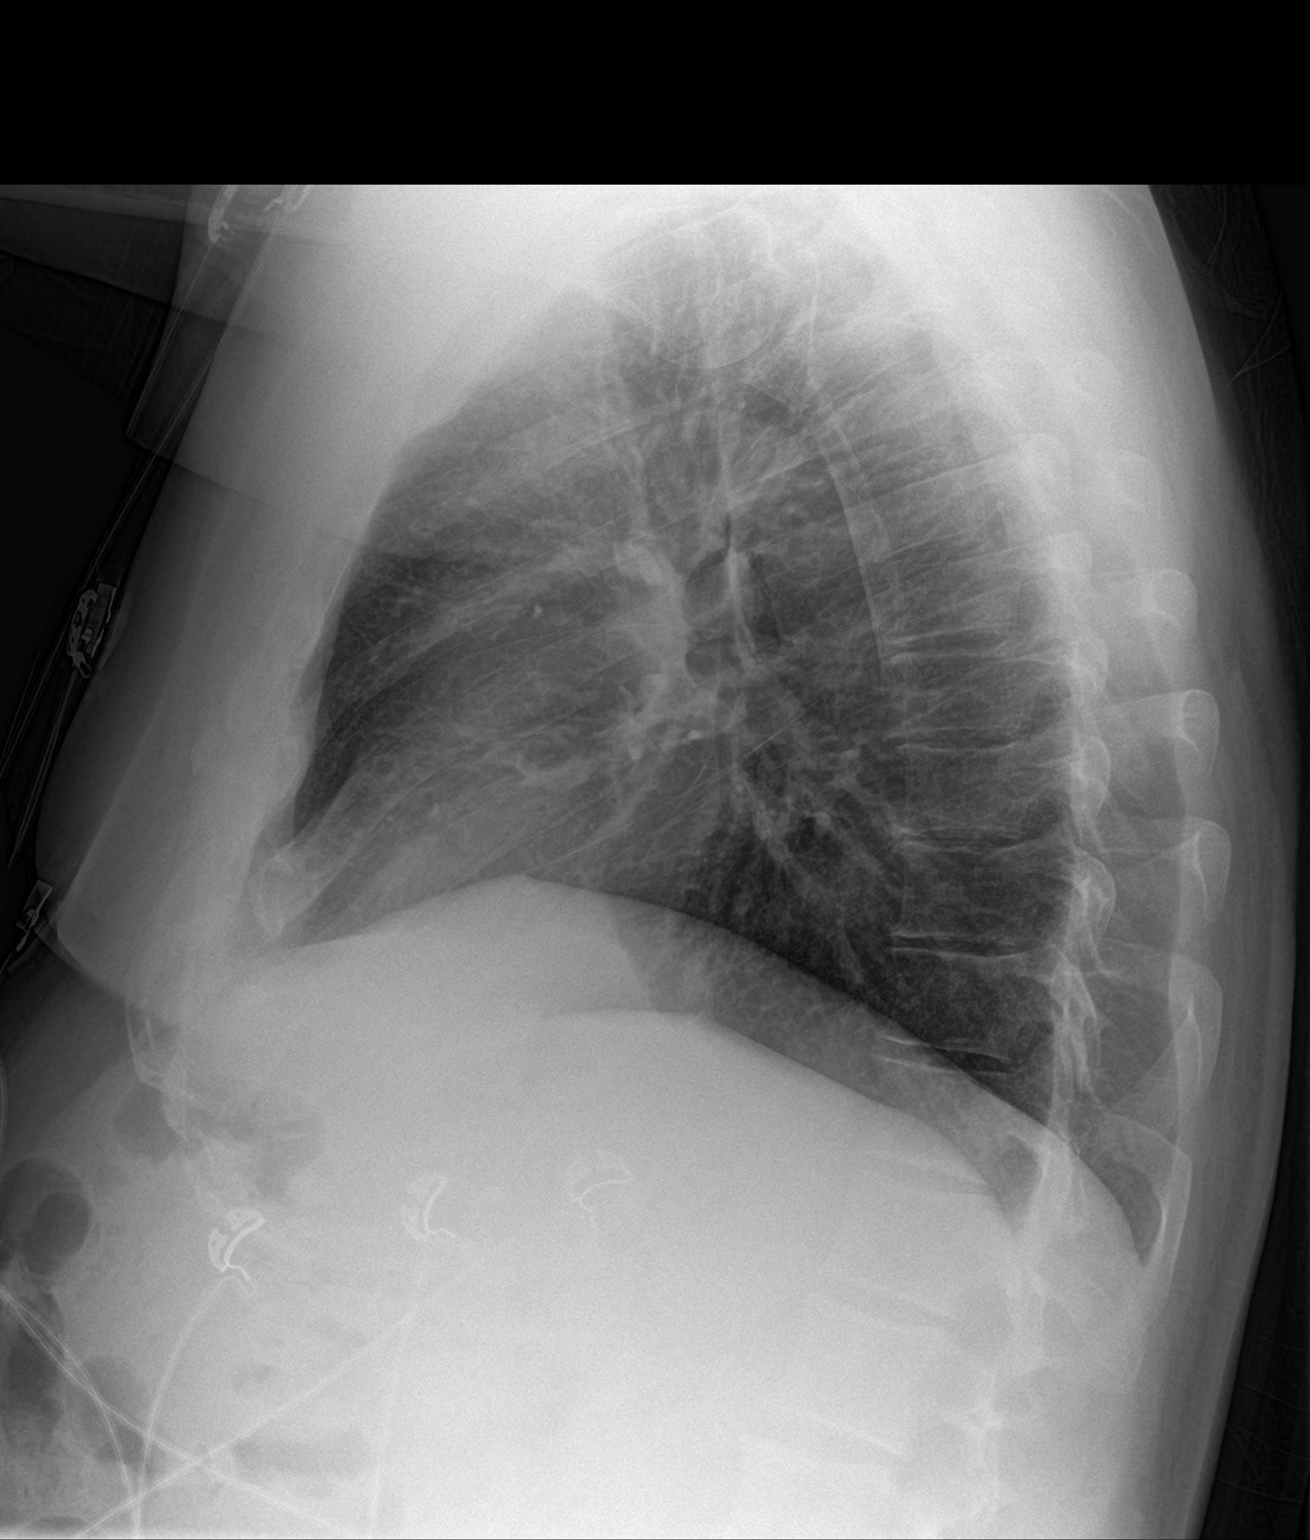

[2 of 2 positions shown; findings below may reference images not displayed]

FINDINGS: Mild bronchitic changes. No focal consolidation or effusion. Normal
cardiomediastinal silhouette. No pneumothorax.
IMPRESSION: Mild bronchitic changes. No focal pulmonary infiltrate.

## 2023-10-13 ENCOUNTER — Other Ambulatory Visit: Payer: Self-pay | Admitting: Family Medicine

## 2023-10-13 DIAGNOSIS — E119 Type 2 diabetes mellitus without complications: Secondary | ICD-10-CM

## 2023-10-19 ENCOUNTER — Ambulatory Visit
Admission: RE | Admit: 2023-10-19 | Discharge: 2023-10-19 | Disposition: A | Discharge status: Home / Self Care | Source: Ambulatory Visit | Attending: Family Medicine | Admitting: Family Medicine

## 2023-10-19 DIAGNOSIS — I10 Essential (primary) hypertension: Secondary | ICD-10-CM | POA: Insufficient documentation

## 2023-10-19 DIAGNOSIS — E119 Type 2 diabetes mellitus without complications: Secondary | ICD-10-CM | POA: Insufficient documentation
